# Patient Record
Sex: Male | Born: 1970 | Race: White | Hispanic: No | Marital: Married | Smoking: Former smoker
Health system: Southern US, Community
[De-identification: ages and names within clinical notes are randomized; demographics above are authoritative.]

## PROBLEM LIST (undated history)

## (undated) DIAGNOSIS — K219 Gastro-esophageal reflux disease without esophagitis: Secondary | ICD-10-CM

## (undated) DIAGNOSIS — E7800 Pure hypercholesterolemia, unspecified: Secondary | ICD-10-CM

## (undated) DIAGNOSIS — I10 Essential (primary) hypertension: Secondary | ICD-10-CM

## (undated) HISTORY — PX: CORONARY ARTERY BYPASS GRAFT: SHX141

---

## 2015-09-29 ENCOUNTER — Encounter (HOSPITAL_COMMUNITY): Payer: Self-pay | Admitting: Emergency Medicine

## 2015-09-29 ENCOUNTER — Emergency Department (HOSPITAL_COMMUNITY)
Admission: EM | Admit: 2015-09-29 | Discharge: 2015-09-29 | Disposition: A | Discharge status: Home / Self Care | Payer: BLUE CROSS/BLUE SHIELD | Attending: Emergency Medicine | Admitting: Emergency Medicine

## 2015-09-29 ENCOUNTER — Emergency Department (HOSPITAL_COMMUNITY): Payer: BLUE CROSS/BLUE SHIELD

## 2015-09-29 DIAGNOSIS — Y998 Other external cause status: Secondary | ICD-10-CM | POA: Diagnosis not present

## 2015-09-29 DIAGNOSIS — S99911A Unspecified injury of right ankle, initial encounter: Secondary | ICD-10-CM | POA: Diagnosis present

## 2015-09-29 DIAGNOSIS — Z8719 Personal history of other diseases of the digestive system: Secondary | ICD-10-CM | POA: Insufficient documentation

## 2015-09-29 DIAGNOSIS — S82401A Unspecified fracture of shaft of right fibula, initial encounter for closed fracture: Secondary | ICD-10-CM

## 2015-09-29 DIAGNOSIS — Y92330 Ice skating rink (indoor) (outdoor) as the place of occurrence of the external cause: Secondary | ICD-10-CM | POA: Insufficient documentation

## 2015-09-29 DIAGNOSIS — S82431A Displaced oblique fracture of shaft of right fibula, initial encounter for closed fracture: Secondary | ICD-10-CM | POA: Diagnosis not present

## 2015-09-29 DIAGNOSIS — Y9322 Activity, ice hockey: Secondary | ICD-10-CM | POA: Insufficient documentation

## 2015-09-29 DIAGNOSIS — W01198A Fall on same level from slipping, tripping and stumbling with subsequent striking against other object, initial encounter: Secondary | ICD-10-CM | POA: Insufficient documentation

## 2015-09-29 DIAGNOSIS — Z87891 Personal history of nicotine dependence: Secondary | ICD-10-CM | POA: Insufficient documentation

## 2015-09-29 DIAGNOSIS — Z8639 Personal history of other endocrine, nutritional and metabolic disease: Secondary | ICD-10-CM | POA: Insufficient documentation

## 2015-09-29 DIAGNOSIS — I10 Essential (primary) hypertension: Secondary | ICD-10-CM | POA: Insufficient documentation

## 2015-09-29 HISTORY — DX: Pure hypercholesterolemia, unspecified: E78.00

## 2015-09-29 HISTORY — DX: Essential (primary) hypertension: I10

## 2015-09-29 HISTORY — DX: Gastro-esophageal reflux disease without esophagitis: K21.9

## 2015-09-29 MED ORDER — OXYCODONE-ACETAMINOPHEN 5-325 MG PO TABS: 1.0000 | ORAL_TABLET | Freq: Four times a day (QID) | ORAL | Status: AC | PRN

## 2015-09-29 MED ORDER — OXYCODONE-ACETAMINOPHEN 5-325 MG PO TABS
1.0000 | ORAL_TABLET | Freq: Once | ORAL | Status: AC
  Administered 2015-09-29: 1 via ORAL
  Filled 2015-09-29: qty 1

## 2015-09-29 NOTE — Discharge Instructions (Signed)
Call and follow up with orthopedic specialist this week for further management of your broken ankle.  Do not bear any weight on the affected ankle.  Use crutches when walking.   Fibular Fracture With Rehab The fibula is the smaller of the two lower leg bones and is vulnerable to breaks (fracture). Fibular fractures may go fully through the bone (complete) or partially (incomplete). The bone fragments are rarely out of alignment (displaced fracture). Fibula fractures may occur anywhere along the bone. However, this document only discusses fractures that do not involve a leg joint. Fibular fractures are not often a severe injury because the bone supports only about 17% of the body weight. SYMPTOMS   Moderate to severe pain in the lower leg.  Tenderness and swelling in the leg or calf.  Bleeding and/or bruising (contusion) in the leg.  Inability to bear weight on the injured extremity.  Visible deformity, if the fracture is displaced.  Numbness and coldness in the leg and foot, beyond the fracture site, if blood supply is impaired. CAUSES  Fractures occur when a force is placed on the bone that is greater than it can withstand. Common causes of fibular fracture include:  Direct hit (trauma) (i.e., hockey or lacrosse check to the lower leg).  Stress fracture (weakening of the bone from repeated stress).  Indirect injury, caused by twisting, turning quickly, or violent muscle contraction. RISK INCREASES WITH:  Contact sports (i.e., football, soccer, lacrosse, hockey).  Sports that can cause twisted ankle injury (i.e., skiing, basketball).  Bony abnormalities (i.e., osteoporosis or bone tumors).  Metabolism disorders, hormone problems, and nutrition deficiency and disorders (i.e., anorexia and bulimia).  Poor strength and flexibility. PREVENTION   Warm up and stretch properly before activity.  Maintain physical fitness:  Strength, flexibility, and endurance.  Cardiovascular  fitness.  Wear properly fitted and padded protective equipment (i.e., shin guards for soccer). PROGNOSIS  If treated properly, fibular fractures usually heal in 4 to 6 weeks.  RELATED COMPLICATIONS   Failure of bone to heal (nonunion).  Bone heals in a poor position (malunion).  Increased pressure inside the leg (compartment syndrome) due to injury that disrupts the blood supply to the leg and foot and injures the nerves and muscles (uncommon).  Shortening of the injured bones.  Hindrance of normal bone growth in children.  Risks of surgery: infection, bleeding, injury to nerves (numbness, weakness, paralysis), need for further surgery.  Longer healing time if activity is resumed too soon. TREATMENT Treatment first involves ice, medicine, and elevation of the leg to reduce pain and inflammation. People with fibular fractures are advised to walk using crutches. A brace or walking boot may be given to restrain the injured leg and allow for healing. Sometimes, surgery is needed to place a rod, plate, or screws in the bones in order to fix the fracture. After surgery, the leg is restrained. After restraint (with or without surgery), it is important to complete strengthening and stretching exercises to regain strength and a full range of motion. Exercises may be completed at home or with a therapist. MEDICATION   If pain medicine is needed, nonsteroidal anti-inflammatory medicines (aspirin and ibuprofen), or other minor pain relievers (acetaminophen), are often advised.  Do not take pain medicine for 7 days before surgery.  Prescription pain relievers may be given if your health care provider thinks they are needed. Use only as directed and only as much as you need. SEEK MEDICAL CARE IF:  Symptoms get worse or do not  improve in 2 weeks, despite treatment.  The following occur after restraint or surgery. (Report any of these signs immediately):  Swelling above or below the fracture  site.  Severe, persistent pain.  Blue or gray skin below the fracture site, especially under the toenails. Numbness or loss of feeling below the fracture site.  New, unexplained symptoms develop. (Drugs used in treatment may produce side effects.) EXERCISES  RANGE OF MOTION (ROM) AND STRETCHING EXERCISES - Fibular Fracture These exercises may help you when beginning to recover from your injury. Your symptoms may go away with or without further involvement from your physician, physical therapist or athletic trainer. While completing these exercises, remember:   Restoring tissue flexibility helps normal motion to return to the joints. This allows healthier, less painful movement and activity.  An effective stretch should be held for at least 30 seconds.  A stretch should never be painful. You should only feel a gentle lengthening or release in the stretched tissue. RANGE OF MOTION - Dorsi/Plantar Flexion  While sitting with your right / left knee straight, draw the top of your foot upwards by flexing your ankle. Then reverse the motion, pointing your toes downward.  Hold each position for __________ seconds.  After completing your first set of exercises, repeat this exercise with your knee bent. Repeat __________ times. Complete this exercise __________ times per day.  STRETCH - Gastrocsoleus   Sit with your right / left leg extended. Holding onto both ends of a belt or towel, loop it around the ball of your foot.  Keeping your right / left ankle and foot relaxed and your knee straight, pull your foot and ankle toward you using the belt.  You should feel a gentle stretch behind your calf or knee. Hold this position for __________ seconds. Repeat __________ times. Complete this stretch __________ times per day.  RANGE OF MOTION- Ankle Plantar Flexion   Sit with your right / left leg crossed over your opposite knee.  Use your opposite hand to pull the top of your foot and toes toward  you.  You should feel a gentle stretch on the top of your foot and ankle. Hold this position for __________ seconds. Repeat __________ times. Complete __________ times per day.  RANGE OF MOTION - Ankle Eversion  Sit with your right / left ankle crossed over your opposite knee.  Grip your foot with your opposite hand, placing your thumb on the top of your foot and your fingers across the bottom of your foot.  Gently push your foot downward with a slight rotation so your littlest toes rise slightly toward the ceiling.  You should feel a gentle stretch on the inside of your ankle. Hold the stretch for __________ seconds. Repeat __________ times. Complete this exercise __________ times per day.  RANGE OF MOTION - Ankle Inversion  Sit with your right / left ankle crossed over your opposite knee.  Grip your foot with your opposite hand, placing your thumb on the bottom of your foot and your fingers across the top of your foot.  Gently pull your foot so the smallest toe comes toward you and your thumb pushes the inside of the ball of your foot away from you.  You should feel a gentle stretch on the outside of your ankle. Hold the stretch for __________ seconds. Repeat __________ times. Complete this exercise __________ times per day.  RANGE OF MOTION - Ankle Alphabet  Imagine your right / left big toe is a pen.  Keeping your hip and knee still, write out the entire alphabet with your "pen." Make the letters as large as you can, without increasing any discomfort. Repeat __________ times. Complete this exercise __________ times per day.  RANGE OF MOTION - Ankle Dorsiflexion, Active Assisted  Remove your shoes and sit on a chair, preferably not on a carpeted surface.  Place your right / left foot on the floor, directly under your knee. Extend your opposite leg for support.  Keeping your heel down, slide your right / left foot back toward the chair, until you feel a stretch at your ankle or  calf. If you do not feel a stretch, slide your bottom forward to the edge of the chair, while still keeping your heel down.  Hold this stretch for __________ seconds. Repeat __________ times. Complete this stretch __________ times per day.  STRENGTHENING EXERCISES - Fibular Fracture These exercises may help you when beginning to recover from your injury. They may resolve your symptoms with or without further involvement from your physician, physical therapist or athletic trainer. While completing these exercises, remember:   Muscles can gain both the endurance and the strength needed for everyday activities through controlled exercises.  Complete these exercises as instructed by your physician, physical therapist or athletic trainer. Increase the resistance and repetitions only as guided.  You may experience muscle soreness or fatigue, but the pain or discomfort you are trying to eliminate should never worsen during these exercises. If this pain does get worse, stop and make certain you are following the directions exactly. If the pain is still present after adjustments, discontinue the exercise until you can discuss the trouble with your clinician. STRENGTH - Dorsiflexors  Secure a rubber exercise band or tubing to a fixed object (table, pole) and loop the other end around your right / left foot.  Sit on the floor, facing the fixed object. The band should be slightly tense when your foot is relaxed.  Slowly draw your foot back toward you, using your ankle and toes.  Hold this position for __________ seconds. Slowly release the tension in the band and return your foot to the starting position. Repeat __________ times. Complete this exercise __________ times per day.  STRENGTH - Plantar-flexors  Sit with your right / left leg extended. Holding onto both ends of a rubber exercise band or tubing, loop it around the ball of your foot. Keep a slight tension in the band.  Slowly push your toes  away from you, pointing them downward.  Hold this position for __________ seconds. Return to the starting position slowly, controlling the tension in the band. Repeat __________ times. Complete this exercise __________ times per day.  STRENGTH - Plantar-flexors, Standing   Stand with your feet shoulder width apart. Place your hands on a wall or table to steady yourself, using as little support as needed.  Keeping your weight evenly spread over the width of your feet, rise up on your toes.*  Hold this position for __________ seconds. Repeat __________ times. Complete this exercise __________ times per day.  *If this is too easy, shift your weight toward your right / left leg until you feel challenged. Ultimately, you may be asked to do this exercise while standing on your right / left foot only. STRENGTH - Towel Curls  Sit in a chair, on a non-carpeted surface.  Place your foot on a towel, keeping your heel on the floor.  Pull the towel toward your heel only by curling your toes.  Keep your heel on the floor.  If instructed by your physician, physical therapist or athletic trainer, add ____________________ at the end of the towel. Repeat __________ times. Complete this exercise __________ times per day. STRENGTH - Ankle Eversion  Secure one end of a rubber exercise band or tubing to a fixed object (table, pole). Loop the other end around your foot, just before your toes.  Place your fists between your knees. This will focus your strengthening at your ankle.  Drawing the band across your opposite foot, away from the pole, slowly pull your little toe out and up. Make sure the band is positioned to resist the entire motion.  Hold this position for __________ seconds.  Return to the starting position slowly, controlling the tension in the band. Repeat __________ times. Complete this exercise __________ times per day.  STRENGTH - Ankle Inversion  Secure one end of a rubber exercise band  or tubing to a fixed object (table, pole). Loop the other end around your foot, just before your toes.  Place your fists between your knees. This will focus your strengthening at your ankle.  Slowly, pull your big toe up and in, making sure the band is positioned to resist the entire motion.  Hold this position for __________ seconds.  Return to the starting position slowly, controlling the tension in the band. Repeat __________ times. Complete this exercises __________ times per day.    This information is not intended to replace advice given to you by your health care provider. Make sure you discuss any questions you have with your health care provider.   Document Released: 11/02/2005 Document Revised: 11/23/2014 Document Reviewed: 02/14/2009 Elsevier Interactive Patient Education [employer]

## 2015-09-29 NOTE — Progress Notes (Signed)
Orthopedic Tech Progress Note Patient Details:  Jeff [name] 08-15-1971 [id number]  Ortho Devices Type of Ortho Device: Ace wrap, Crutches, Post (short leg) splint Ortho Device/Splint Location: RLE Ortho Device/Splint Interventions: Ordered, Application   [name], [name] 09/29/2015, 8:48 PM

## 2015-09-29 NOTE — ED Provider Notes (Signed)
CSN: [id number]     Arrival date & time 09/29/15  1919 History   First MD Initiated Contact with Patient 09/29/15 1955     Chief Complaint  Patient presents with  . Ankle Pain     (Consider location/radiation/quality/duration/timing/severity/associated sxs/prior Treatment) HPI   44 year old male presents for evaluation of right ankle injury. Patient injured his ankle while playing hockey today. Patient states he slide on the skating rink towards the wall hitting his right skate into the wall. He noticed that his ankle was bent inward and he heard a loud "pop" follows with acute onset of sharp pain, non radiating, and has been throbbing and persistent.  He was unable to bear weight afterward.  He denies any pain to his knee or foot. He denies hitting his head or loss of consciousness. Ice was applied prior to arrival and no other specific treatment tried. He denies any prior injury to the same ankle. He cannot tolerate NSAIDs.  Past Medical History  Diagnosis Date  . Hypertension   . GERD (gastroesophageal reflux disease)   . High cholesterol    Past Surgical History  Procedure Laterality Date  . Coronary artery bypass graft     No family history on file. Social History  Substance Use Topics  . Smoking status: Former Games developermoker  . Smokeless tobacco: None  . Alcohol Use: No    Review of Systems  Constitutional: Negative for fever.  Musculoskeletal: Positive for joint swelling and arthralgias.  Skin: Negative for wound.  Neurological: Negative for numbness.      Allergies  Review of patient's allergies indicates not on file.  Home Medications   Prior to Admission medications   Not on File   BP 144/90 mmHg  Pulse 88  Temp(Src) 98.1 F (36.7 C) (Oral)  Resp 21  SpO2 96% Physical Exam  Constitutional: He appears well-developed and well-nourished. No distress.  HENT:  Head: Atraumatic.  Eyes: Conjunctivae are normal.  Neck: Neck supple.  Cardiovascular: Intact  distal pulses.   Musculoskeletal: He exhibits tenderness (Right ankle: Point tenderness to lateral malleolus with crepitus noted. Swelling noted. Decreased ankle dorsiflexion and plantarflexion inversion and eversion due to pain. Dorsalis pedis and posterior tibialis pulses intact.).  Right foot is nontender, no pain at fifth metatarsal. Right knee is nontender.  Neurological: He is alert.  Skin: No rash noted.  Psychiatric: He has a normal mood and affect.  Nursing note and vitals reviewed.   ED Course  Procedures (including critical care time)  Patient injured his right ankle while playing hockey today. X-ray demonstrate a minimally displaced oblique fracture through the distal fibula. This is a closed fracture. He is neurovascularly intact. He will be placed in a posterior splint with crutches, nonweightbearing, pain medication prescribed and patient will follow-up with orthopedist for further management. Patient voiced understanding and agrees with plan.  Labs Review Labs Reviewed - No data to display  Imaging Review Dg Ankle Complete Right  09/29/2015  CLINICAL DATA:  Status post fall while playing ice hockey, with twisting injury to the right ankle. Initial encounter. EXAM: RIGHT ANKLE - COMPLETE 3+ VIEW COMPARISON:  None. FINDINGS: There is a minimally displaced oblique fracture through the distal fibula. The ankle mortise is grossly intact; the interosseous space is within normal limits. No talar tilt or subluxation is seen. Plantar and posterior calcaneal spurs are seen. The joint spaces are preserved. No significant soft tissue abnormalities are seen. IMPRESSION: Minimally displaced oblique fracture through the distal fibula. Electronically Signed  By: Roanna Raider M.D.   On: 09/29/2015 20:05   I have personally reviewed and evaluated these images and lab results as part of my medical decision-making.   EKG Interpretation None      MDM   Final diagnoses:  Closed fracture  of right fibula, initial encounter    BP 144/90 mmHg  Pulse 88  Temp(Src) 98.1 F (36.7 C) (Oral)  Resp 21  SpO2 96%     Fayrene Helper, PA-C 09/29/15 2034  Gerhard Munch, MD 09/30/15 [phone]

## 2015-09-29 NOTE — ED Notes (Signed)
C/o R ankle pain since falling while playing ice hockey.  Ice applied pta.

## 2016-07-19 IMAGING — CR DG ANKLE COMPLETE 3+V*R*
3 series · 3 of 3 positions shown · non-contrast
Comparison: None.

CLINICAL DATA: Status post fall while playing ice [place], with
twisting injury to the right ankle. Initial encounter.

EXAM:
RIGHT ANKLE - COMPLETE 3+ VIEW

[ankle ap]
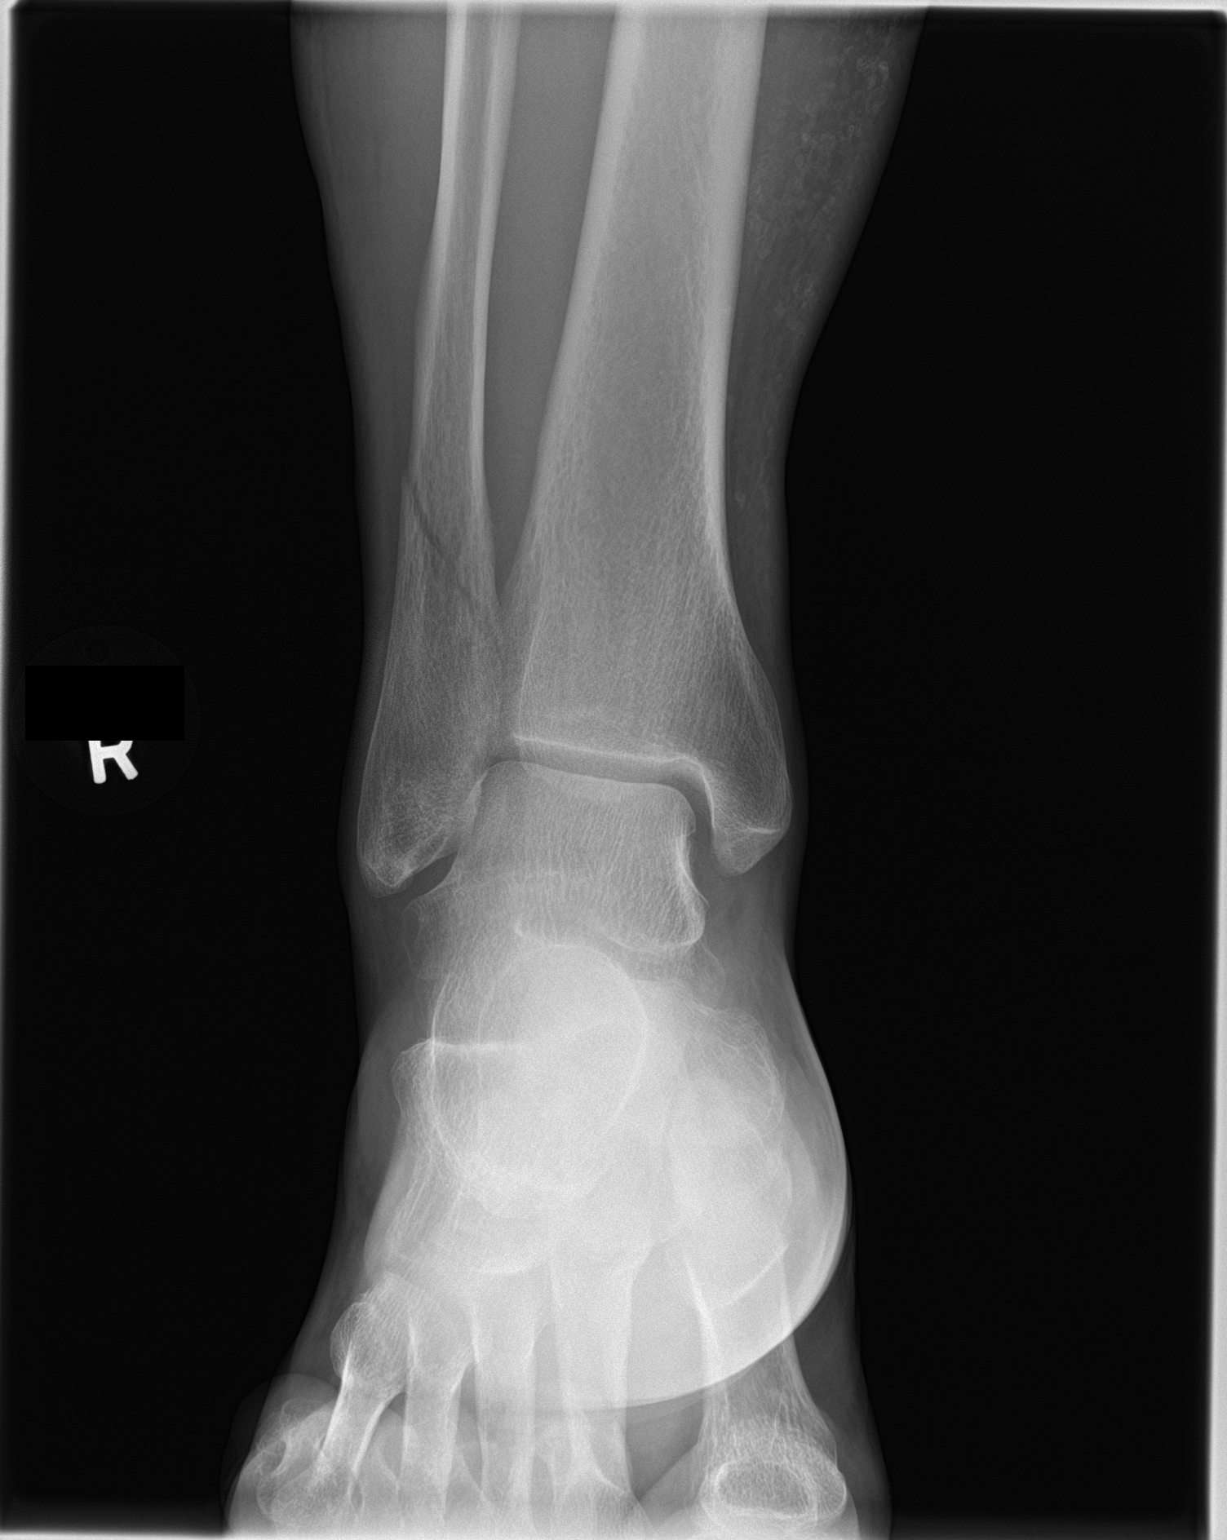

[ankle obl]
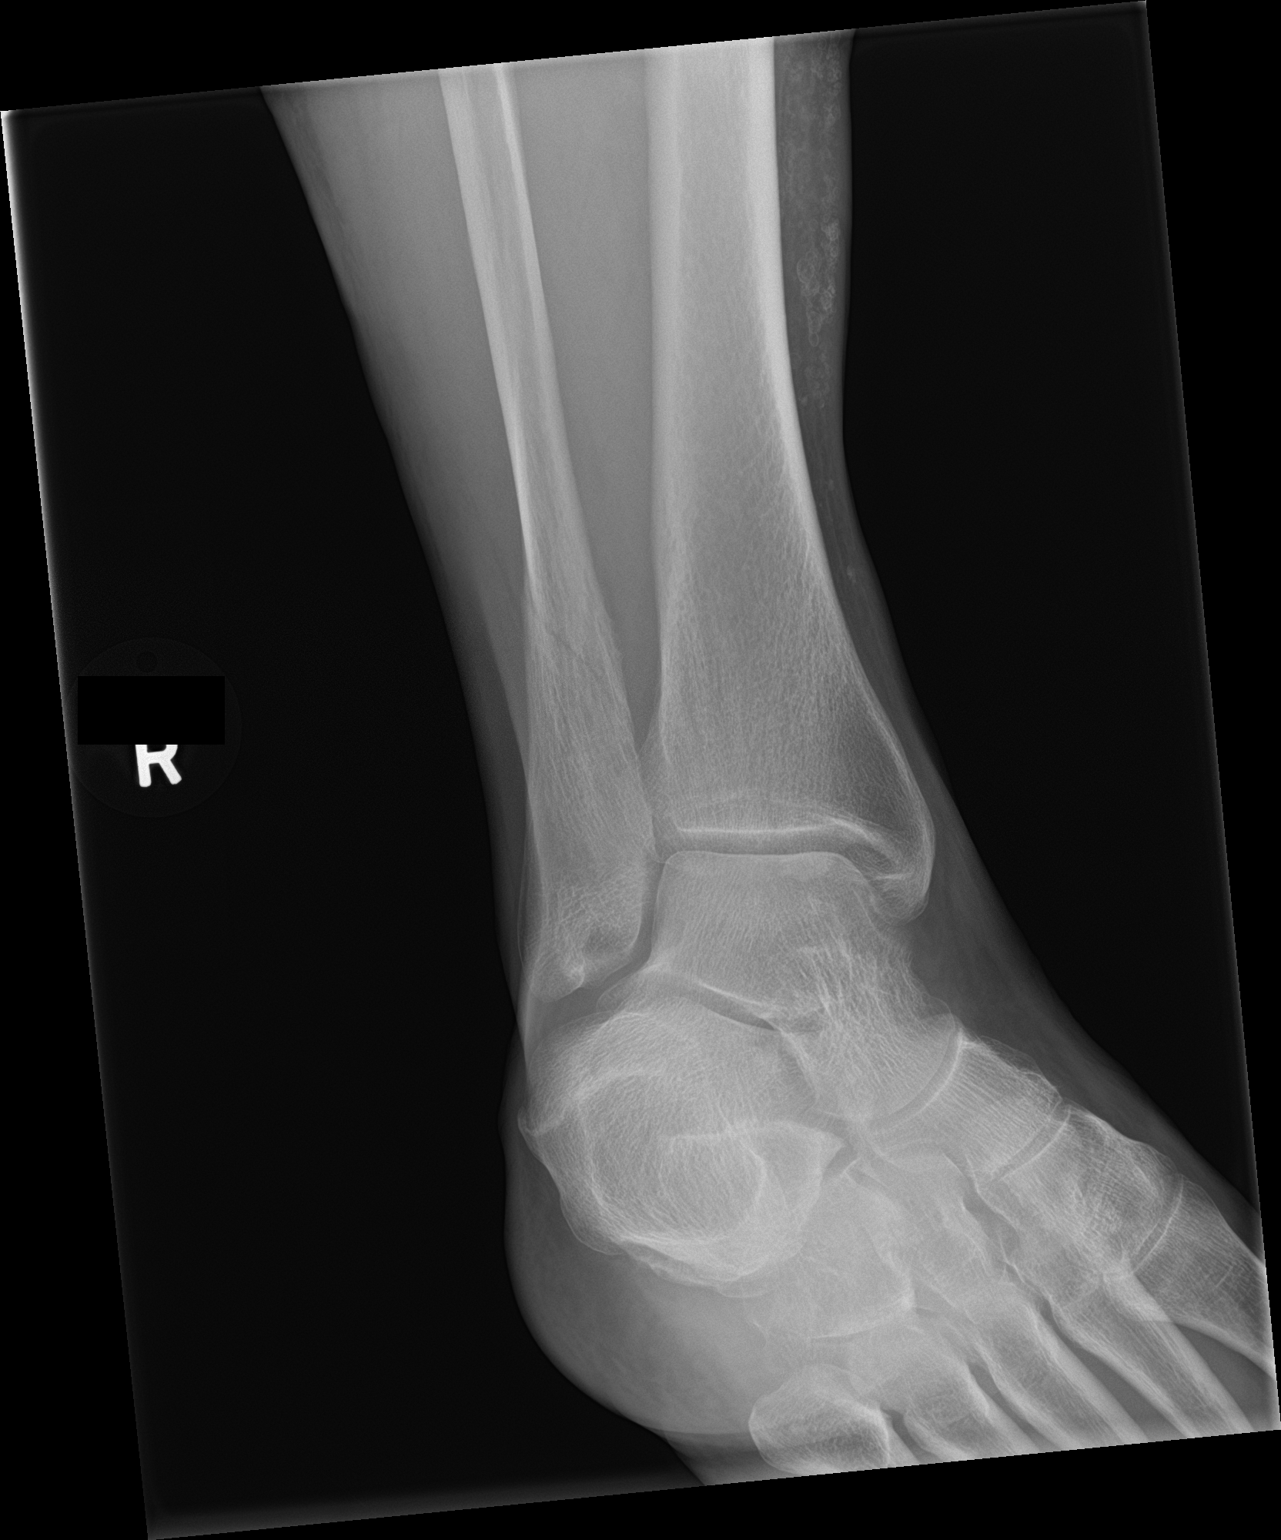

[ankle lat]
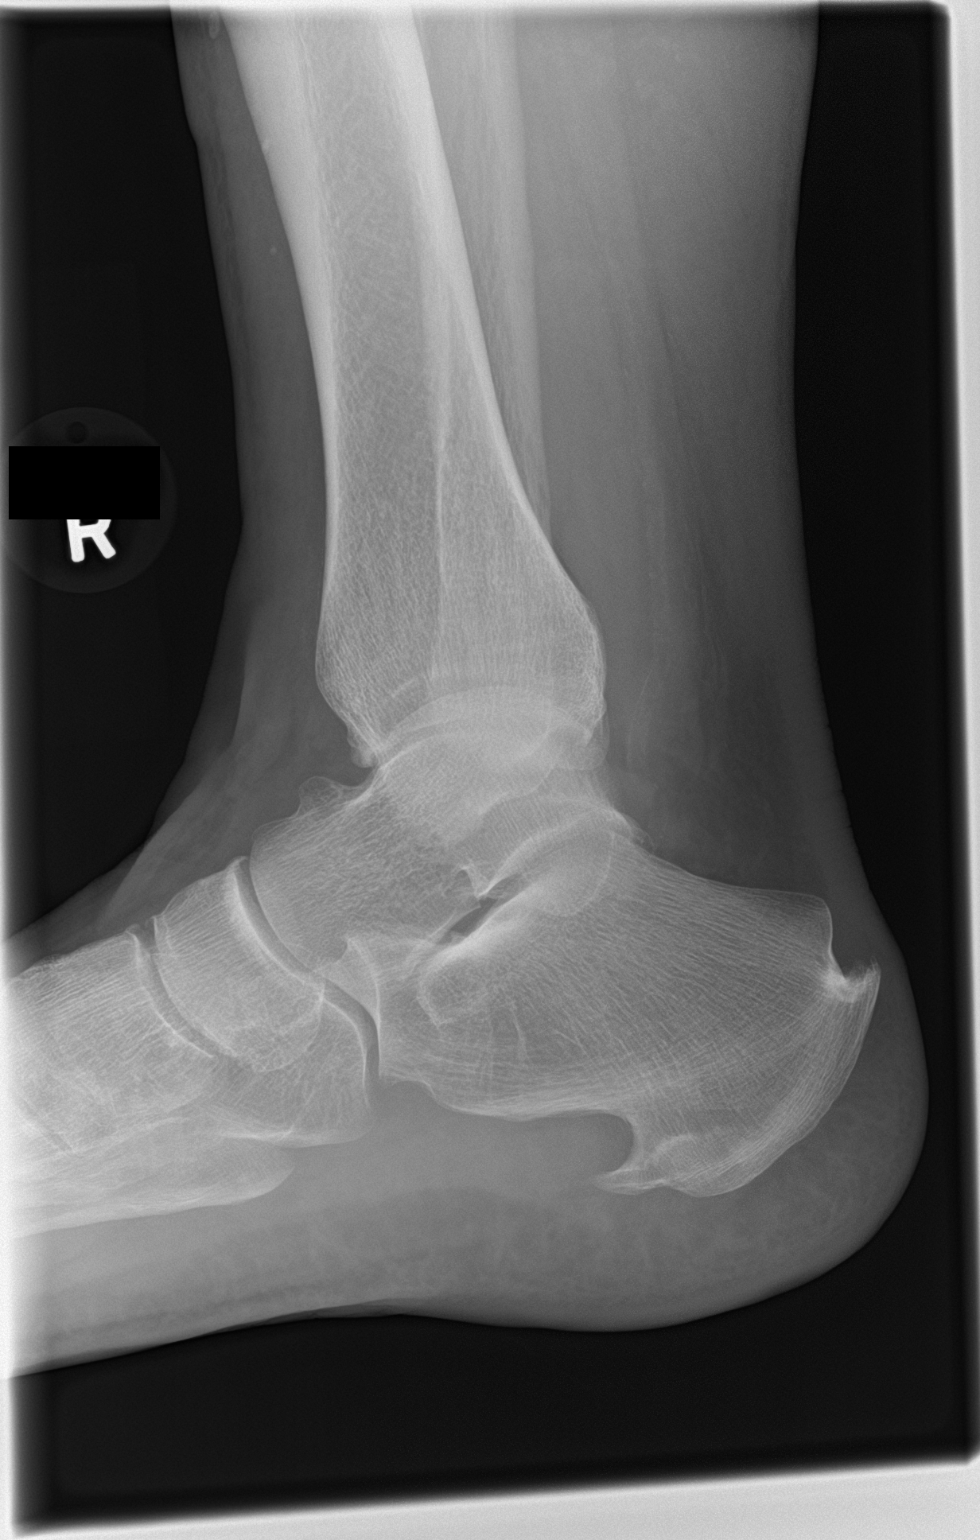

[3 of 3 positions shown; findings below may reference images not displayed]

FINDINGS: There is a minimally displaced oblique fracture through the distal
fibula. The ankle mortise is grossly intact; the interosseous space
is within normal limits. No talar tilt or subluxation is seen.
Plantar and posterior calcaneal spurs are seen.

The joint spaces are preserved. No significant soft tissue
abnormalities are seen.
IMPRESSION: Minimally displaced oblique fracture through the distal fibula.

## 2024-08-11 ENCOUNTER — Ambulatory Visit
Admission: RE | Admit: 2024-08-11 | Discharge: 2024-08-11 | Disposition: A | Discharge status: Home / Self Care | Source: Ambulatory Visit | Attending: Family Medicine | Admitting: Family Medicine

## 2024-08-11 ENCOUNTER — Other Ambulatory Visit: Payer: Self-pay | Admitting: Family Medicine

## 2024-08-11 DIAGNOSIS — Z021 Encounter for pre-employment examination: Secondary | ICD-10-CM
